# Patient Record
Sex: Female | Born: 2000 | Hispanic: No | Marital: Single | Smoking: Never smoker
Health system: Southern US, Community
[De-identification: ages and names within clinical notes are randomized; demographics above are authoritative.]

---
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
## 2019-02-25 ENCOUNTER — Encounter (HOSPITAL_BASED_OUTPATIENT_CLINIC_OR_DEPARTMENT_OTHER): Payer: Self-pay | Admitting: Emergency Medicine

## 2019-02-25 ENCOUNTER — Emergency Department (HOSPITAL_BASED_OUTPATIENT_CLINIC_OR_DEPARTMENT_OTHER)
Admission: EM | Admit: 2019-02-25 | Discharge: 2019-02-25 | Disposition: A | Discharge status: Home / Self Care | Payer: Self-pay | Attending: Emergency Medicine | Admitting: Emergency Medicine

## 2019-02-25 ENCOUNTER — Other Ambulatory Visit: Payer: Self-pay

## 2019-02-25 ENCOUNTER — Emergency Department (HOSPITAL_COMMUNITY): Payer: Self-pay

## 2019-02-25 ENCOUNTER — Emergency Department (HOSPITAL_BASED_OUTPATIENT_CLINIC_OR_DEPARTMENT_OTHER): Payer: Self-pay

## 2019-02-25 DIAGNOSIS — R0789 Other chest pain: Secondary | ICD-10-CM | POA: Insufficient documentation

## 2019-02-25 DIAGNOSIS — N939 Abnormal uterine and vaginal bleeding, unspecified: Secondary | ICD-10-CM | POA: Insufficient documentation

## 2019-02-25 DIAGNOSIS — R0602 Shortness of breath: Secondary | ICD-10-CM

## 2019-02-25 LAB — URINALYSIS, ROUTINE W REFLEX MICROSCOPIC
BILIRUBIN URINE: NEGATIVE
Glucose, UA: NEGATIVE mg/dL
Hgb urine dipstick: NEGATIVE
Ketones, ur: NEGATIVE mg/dL
Leukocytes,Ua: NEGATIVE
Nitrite: NEGATIVE
PH: 7.5 (ref 5.0–8.0)
Protein, ur: NEGATIVE mg/dL
Specific Gravity, Urine: 1.015 (ref 1.005–1.030)

## 2019-02-25 LAB — CBC WITH DIFFERENTIAL/PLATELET
Abs Immature Granulocytes: 0.01 10*3/uL (ref 0.00–0.07)
Basophils Absolute: 0 10*3/uL (ref 0.0–0.1)
Basophils Relative: 0 %
Eosinophils Absolute: 0.1 10*3/uL (ref 0.0–0.5)
Eosinophils Relative: 1 %
HCT: 44.5 % (ref 36.0–46.0)
HEMOGLOBIN: 13.8 g/dL (ref 12.0–15.0)
Immature Granulocytes: 0 %
LYMPHS ABS: 2.4 10*3/uL (ref 0.7–4.0)
LYMPHS PCT: 37 %
MCH: 29 pg (ref 26.0–34.0)
MCHC: 31 g/dL (ref 30.0–36.0)
MCV: 93.5 fL (ref 80.0–100.0)
MONOS PCT: 6 %
Monocytes Absolute: 0.4 10*3/uL (ref 0.1–1.0)
Neutro Abs: 3.5 10*3/uL (ref 1.7–7.7)
Neutrophils Relative %: 56 %
Platelets: 201 10*3/uL (ref 150–400)
RBC: 4.76 MIL/uL (ref 3.87–5.11)
RDW: 12.4 % (ref 11.5–15.5)
WBC: 6.3 10*3/uL (ref 4.0–10.5)
nRBC: 0 % (ref 0.0–0.2)

## 2019-02-25 LAB — WET PREP, GENITAL
Sperm: NONE SEEN
TRICH WET PREP: NONE SEEN
WBC, Wet Prep HPF POC: NONE SEEN
Yeast Wet Prep HPF POC: NONE SEEN

## 2019-02-25 LAB — BASIC METABOLIC PANEL
Anion gap: 7 (ref 5–15)
BUN: 11 mg/dL (ref 6–20)
CHLORIDE: 105 mmol/L (ref 98–111)
CO2: 26 mmol/L (ref 22–32)
Calcium: 9.6 mg/dL (ref 8.9–10.3)
Creatinine, Ser: 0.59 mg/dL (ref 0.44–1.00)
GFR calc Af Amer: >60 mL/min (ref >60)
GFR calc non Af Amer: >60 mL/min (ref >60)
Glucose, Bld: 106 mg/dL — ABNORMAL HIGH (ref 70–99)
Potassium: 4 mmol/L (ref 3.5–5.1)
Sodium: 138 mmol/L (ref 135–145)

## 2019-02-25 LAB — TROPONIN I: Troponin I: <0.03 ng/mL (ref <0.03)

## 2019-02-25 LAB — PREGNANCY, URINE: Preg Test, Ur: NEGATIVE

## 2019-02-25 MED ORDER — KETOROLAC TROMETHAMINE 30 MG/ML IJ SOLN
30.0000 mg | Freq: Once | INTRAMUSCULAR | Status: AC
  Administered 2019-02-25: 30 mg via INTRAVENOUS
  Filled 2019-02-25: qty 1

## 2019-02-25 NOTE — Discharge Instructions (Signed)
As we discussed, follow-up with your OB/GYN regarding the vaginal bleeding.  Your pelvic exam today was unremarkable.  We do have testing for gonorrhea/committee out.  If you are positive, you will be notified.  You can take Tylenol or Ibuprofen as directed for pain. You can alternate Tylenol and Ibuprofen every 4 hours. If you take Tylenol at 1pm, then you can take Ibuprofen at 5pm. Then you can take Tylenol again at 9pm.   Follow-up with [facility] to establish a primary care doctor if you do not have one.   Return to the Emergency Department immediately if you experiencing worsening chest pain, difficulty breathing, nausea/vomiting, get very sweaty, headache or any other worsening or concerning symptoms.

## 2019-02-25 NOTE — ED Notes (Signed)
Constant mid-sternal CP that is worse with deep inspiration and when leaning forward. Pt denies ShOB, nausea, diaphoresis.

## 2019-02-25 NOTE — ED Triage Notes (Signed)
Vaginal bleeding for 3 weeks. Stopped 4 days ago. Constant chest pain for 2 days. No sob or cough.

## 2019-02-25 NOTE — ED Provider Notes (Signed)
[facility] EMERGENCY DEPARTMENT Provider Note   CSN: [id number] Arrival date & time: 02/25/19  1522    History   Chief Complaint Chief Complaint  Patient presents with   Chest Pain    HPI Janice Berg is a 18 y.o. female who presents for evaluation of 3 weeks of vaginal bleeding and chest pain that has been ongoing for last 2 days.  Patient reports that she started having some vaginal bleeding on 02/02/19.  She states that over the next week, it was heavy bleeding and states that she was fully saturating 1-2 pads every 1-2 hours.  She states that she did notice some passage of clots.  Her LMP was 12/14/18.  She is currently sexually active with one partner.  They do not use protection.  She states that sometimes she will take Plan B pills but otherwise does not have any birth control pills.  Patient states that over this last week, she is still continuing bleeding but states it has lightened up and that it stopped completely about 2 or 3 days ago.  Patient reports 2 days ago, she started developing some constant chest pain that she states is in the midsternal region.  She states that this pain is worse if she applies pressure to it, moves her arms or moves around.  Pain is not worse with deep inspiration or exertion.  She states that if she presses down her chest or lays flat on her chest, it makes it hurt more.  She states it is slightly worse laying down but has not noticed if it is better if she leans forward.  Patient states that when she feels like when she presses on her chest, it caused her to "catch her breath" otherwise denies any difficulty breathing.  She does not take any medication for the symptoms.  Patient denies any fevers, cough, nasal congestion, abdominal pain, nausea/vomiting, dysuria, hematuria. She denies any OCP use, recent immobilization, prior history of DVT/PE, recent surgery, leg swelling, or long travel.  Denies any history of hypertension, diabetes.   She denies any personal cardiac history denies any history of MIs before the age of 32 and her family.  She does not smoke and denies any illicit drug use.     The history is provided by the patient.    History reviewed. No pertinent past medical history.  There are no active problems to display for this patient.   History reviewed. No pertinent surgical history.   OB History   No obstetric history on file.      Home Medications    Prior to Admission medications   Not on File    Family History No family history on file.  Social History Social History   Tobacco Use   Smoking status: Never Smoker   Smokeless tobacco: Never Used  Substance Use Topics   Alcohol use: Yes    Comment: occasional.   Drug use: Never     Allergies   Amoxicillin and Penicillins   Review of Systems Review of Systems  Constitutional: Negative for fever.  Respiratory: Negative for cough and shortness of breath.   Cardiovascular: Positive for chest pain. Negative for leg swelling.  Gastrointestinal: Negative for abdominal pain, nausea and vomiting.  Genitourinary: Positive for vaginal bleeding. Negative for dysuria and hematuria.  Neurological: Negative for headaches.  All other systems reviewed and are negative.    Physical Exam Updated Vital Signs BP 109/65 (BP Location: Left Arm)    Pulse 86  Temp 98.4 F (36.9 C) (Oral)    Resp 18    Ht 5' (1.524 m)    Wt 61.2 kg    LMP 02/09/2019 Comment: bleeding constantly x 2 1/2 wks   SpO2 100%    BMI 26.37 kg/m   Physical Exam Vitals signs and nursing note reviewed. Exam conducted with a chaperone present.  Constitutional:      Appearance: Normal appearance. She is well-developed.  HENT:     Head: Normocephalic and atraumatic.  Eyes:     General: Lids are normal.     Conjunctiva/sclera: Conjunctivae normal.     Pupils: Pupils are equal, round, and reactive to light.  Neck:     Musculoskeletal: Full passive range of motion  without pain.  Cardiovascular:     Rate and Rhythm: Normal rate and regular rhythm.     Pulses: Normal pulses.     Heart sounds: Normal heart sounds. No murmur. No friction rub. No gallop.   Pulmonary:     Effort: Pulmonary effort is normal.     Breath sounds: Normal breath sounds.     Comments: Lungs clear to auscultation bilaterally.  Symmetric chest rise.  No wheezing, rales, rhonchi. Chest:     Chest wall: Tenderness present.       Comments: Pain reproduced with palpation of chest and with moving arms. Abdominal:     Palpations: Abdomen is soft. Abdomen is not rigid.     Tenderness: There is no abdominal tenderness. There is no guarding.     Comments: Abdomen is soft, non-distended, non-tender. No rigidity, No guarding. No peritoneal signs.  Genitourinary:    Vagina: Normal. No bleeding.     Cervix: No cervical motion tenderness, discharge or friability.     Adnexa: Right adnexa normal and left adnexa normal.       Right: No mass or tenderness.         Left: No mass or tenderness.       Comments: The exam was performed with a chaperone present. Normal external female genitalia. No lesions, rash, or sores.  No CMT.  No cervical discharge, friability.  No bleeding evidence.  No adnexal mass or tenderness bilaterally. Musculoskeletal: Normal range of motion.  Skin:    General: Skin is warm and dry.     Capillary Refill: Capillary refill takes less than 2 seconds.  Neurological:     Mental Status: She is alert and oriented to person, place, and time.  Psychiatric:        Speech: Speech normal.      ED Treatments / Results  Labs (all labs ordered are listed, but only abnormal results are displayed) Labs Reviewed  WET PREP, GENITAL - Abnormal; Notable for the following components:      Result Value   Clue Cells Wet Prep HPF POC PRESENT (*)    All other components within normal limits  BASIC METABOLIC PANEL - Abnormal; Notable for the following components:   Glucose, Bld  106 (*)    All other components within normal limits  URINALYSIS, ROUTINE W REFLEX MICROSCOPIC  PREGNANCY, URINE  CBC WITH DIFFERENTIAL/PLATELET  TROPONIN I  GC/CHLAMYDIA PROBE AMP ([place]) NOT AT [facility]    EKG None  Radiology Dg Chest 2 View  Result Date: 02/25/2019 CLINICAL DATA:  Chest pain EXAM: CHEST - 2 VIEW COMPARISON:  None. FINDINGS: The heart size and mediastinal contours are within normal limits. Both lungs are clear. The visualized skeletal structures are unremarkable. IMPRESSION: No  active cardiopulmonary disease. Electronically Signed   By: Jasmine Pang M.D.   On: 02/25/2019 16:55    Procedures Procedures (including critical care time)  Medications Ordered in ED Medications  ketorolac (TORADOL) 30 MG/ML injection 30 mg (30 mg Intravenous Given 02/25/19 1721)     Initial Impression / Assessment and Plan / ED Course  I have reviewed the triage vital signs and the nursing notes.  Pertinent labs & imaging results that were available during my care of the patient were reviewed by me and considered in my medical decision making (see chart for details).        18 year old female who presents for evaluation of vaginal bleeding is been ongoing for last several weeks.  She reports that as improved but she also started developing some chest pain that began about 2 days ago. Patient is afebrile, non-toxic appearing, sitting comfortably on examination table. Vital signs reviewed and stable.  No fever, cough, shortness of breath.  She does feel like whenever she presses on it, it causes her to catch her breath.  She states pain is worse with pressure, movement.  Patient with no history of OCPs, blood clots, leg swelling. Patient is PERC negative.  Do not suspect PE as a source of patient's symptoms.  Doubt ACS etiology as this is atypical presentation, but a consideration.  Additionally, do not suspect pericarditis, myocarditis.  Low suspicion for infectious etiology given lack  of symptoms. Consider  musculoskeletal etiology, particularly, given that the pain is worse with movement..  Will plan to check labs, EKG, chest x-ray as well as pelvic.  Pelvic exam as documented above.  Exam not concerning for PID.  No evidence of vaginal bleeding.  Troponin negative.  BMP is unremarkable.  CBC shows no evidence of leukocytosis or anemia.  Urine pregnancy is negative.  UA negative for any infectious etiology.  Wet prep positive for clue cells.  Given that patient is not symptomatic, will not treat.  Given that her symptoms have been constant for 2 days and as well as her atypical presentation, 1 troponin is sufficient for ACS rule out.  Additionally, patient has no risk factors for ACS etiology.  Reevaluation.  Patient reports feeling improvement in pain after analgesics here in the ED.  At this time, I suspect this most likely related to musculoskeletal etiology. At this time, patient exhibits no emergent life-threatening condition that require further evaluation in ED or admission.  Patient to follow-up with outpatient OB/GYN for persistent vaginal bleeding. Patient had ample opportunity for questions and discussion. All patient's questions were answered with full understanding. Strict return precautions discussed. Patient expresses understanding and agreement to plan.   Portions of this note were generated with Scientist, clinical (histocompatibility and immunogenetics). Dictation errors may occur despite best attempts at proofreading.   Final Clinical Impressions(s) / ED Diagnoses   Final diagnoses:  Atypical chest pain  Abnormal uterine bleeding    ED Discharge Orders    None       Rosana Hoes 02/25/19 2316    Little, Ambrose [place], MD 02/27/19 1200

## 2019-02-28 LAB — GC/CHLAMYDIA PROBE AMP (~~LOC~~) NOT AT ARMC
CHLAMYDIA, DNA PROBE: NEGATIVE
NEISSERIA GONORRHEA: NEGATIVE

## 2019-05-26 DIAGNOSIS — H52223 Regular astigmatism, bilateral: Secondary | ICD-10-CM | POA: Diagnosis not present

## 2019-05-26 DIAGNOSIS — H5213 Myopia, bilateral: Secondary | ICD-10-CM | POA: Diagnosis not present

## 2019-05-27 DIAGNOSIS — H5213 Myopia, bilateral: Secondary | ICD-10-CM | POA: Diagnosis not present

## 2019-06-07 DIAGNOSIS — H5213 Myopia, bilateral: Secondary | ICD-10-CM | POA: Diagnosis not present

## 2019-10-18 DIAGNOSIS — Z20828 Contact with and (suspected) exposure to other viral communicable diseases: Secondary | ICD-10-CM | POA: Diagnosis not present

## 2019-10-18 DIAGNOSIS — R05 Cough: Secondary | ICD-10-CM | POA: Diagnosis not present

## 2019-10-18 DIAGNOSIS — J029 Acute pharyngitis, unspecified: Secondary | ICD-10-CM | POA: Diagnosis not present

## 2019-10-18 DIAGNOSIS — R52 Pain, unspecified: Secondary | ICD-10-CM | POA: Diagnosis not present

## 2019-10-25 ENCOUNTER — Other Ambulatory Visit: Payer: Self-pay

## 2019-10-25 ENCOUNTER — Emergency Department (INDEPENDENT_AMBULATORY_CARE_PROVIDER_SITE_OTHER)
Admission: EM | Admit: 2019-10-25 | Discharge: 2019-10-25 | Disposition: A | Discharge status: Home / Self Care | Payer: Medicaid Other | Source: Home / Self Care

## 2019-10-25 DIAGNOSIS — Z20828 Contact with and (suspected) exposure to other viral communicable diseases: Secondary | ICD-10-CM

## 2019-10-25 DIAGNOSIS — R059 Cough, unspecified: Secondary | ICD-10-CM

## 2019-10-25 DIAGNOSIS — R05 Cough: Secondary | ICD-10-CM | POA: Diagnosis not present

## 2019-10-25 DIAGNOSIS — Z20822 Contact with and (suspected) exposure to covid-19: Secondary | ICD-10-CM

## 2019-10-25 DIAGNOSIS — J069 Acute upper respiratory infection, unspecified: Secondary | ICD-10-CM | POA: Diagnosis not present

## 2019-10-25 NOTE — ED Provider Notes (Signed)
[facility]    CSN: [id number] Arrival date & time: 10/25/19  1401      History   Chief Complaint Chief Complaint  Patient presents with  . Cough  . Fatigue  . Bodyaches    HPI Jamila Slatten is a 18 y.o. female.   18 year old female, with no significant past medical history, presenting today complaining of flulike symptoms.  Patient states that for the past 7 days, she has had cough, fatigue, body aches and sore throat.  States that she had a exposure to Covid at work and has had symptoms since that time.  Had a rapid test done in a different location last week that was negative.  However, since she is still having symptoms, her job is requiring her to have a send out test.  Patient states that she is actually feeling better since the onset of her symptoms.  She has not had fever in the past few days.  Has been using DayQuil and NyQuil with improvement of her symptoms.  No chest pain or shortness of breath.  The history is provided by the patient.  Cough Cough characteristics:  Dry Sputum characteristics:  Nondescript Severity:  Moderate Onset quality:  Gradual Duration:  7 days Timing:  Intermittent Progression:  Unchanged Chronicity:  New Smoker: no   Context: sick contacts   Context: not animal exposure, not exposure to allergens and not fumes   Relieved by:  Cough suppressants Worsened by:  Nothing Ineffective treatments:  None tried Associated symptoms: chills, fever, headaches, myalgias, rhinorrhea, sinus congestion and sore throat   Associated symptoms: no chest pain, no diaphoresis, no ear fullness, no ear pain, no eye discharge, no rash, no shortness of breath, no weight loss and no wheezing   Risk factors: no chemical exposure, no recent infection and no recent travel     History reviewed. No pertinent past medical history.  There are no active problems to display for this patient.   History reviewed. No pertinent surgical history.  OB  History   No obstetric history on file.      Home Medications    Prior to Admission medications   Not on File    Family History History reviewed. No pertinent family history.  Social History Social History   Tobacco Use  . Smoking status: Never Smoker  . Smokeless tobacco: Never Used  Substance Use Topics  . Alcohol use: Yes    Comment: occasional.  . Drug use: Never     Allergies   Amoxicillin and Penicillins   Review of Systems Review of Systems  Constitutional: Positive for chills and fever. Negative for diaphoresis and weight loss.  HENT: Positive for rhinorrhea and sore throat. Negative for ear pain.   Eyes: Negative for pain, discharge and visual disturbance.  Respiratory: Positive for cough. Negative for shortness of breath and wheezing.   Cardiovascular: Negative for chest pain and palpitations.  Gastrointestinal: Negative for abdominal pain and vomiting.  Genitourinary: Negative for dysuria and hematuria.  Musculoskeletal: Positive for myalgias. Negative for arthralgias and back pain.  Skin: Negative for color change and rash.  Neurological: Positive for headaches. Negative for seizures and syncope.  All other systems reviewed and are negative.    Physical Exam Triage Vital Signs ED Triage Vitals  Enc Vitals Group     BP 10/25/19 1419 114/76     Pulse Rate 10/25/19 1419 83     Resp 10/25/19 1419 18     Temp 10/25/19 1419 97.9 F (  36.6 C)     Temp Source 10/25/19 1419 Oral     SpO2 10/25/19 1419 98 %     Weight --      Height --      Head Circumference --      Peak Flow --      Pain Score 10/25/19 1420 0     Pain Loc --      Pain Edu? --      Excl. in [place]? --    No data found.  Updated Vital Signs BP 114/76 (BP Location: Right Arm)   Pulse 83   Temp 97.9 F (36.6 C) (Oral)   Resp 18   LMP  (LMP Unknown)   SpO2 98%   Visual Acuity Right Eye Distance:   Left Eye Distance:   Bilateral Distance:    Right Eye Near:   Left Eye  Near:    Bilateral Near:     Physical Exam Vitals signs and nursing note reviewed.  Constitutional:      General: She is not in acute distress.    Appearance: She is well-developed.  HENT:     Head: Normocephalic and atraumatic.     Right Ear: Hearing, tympanic membrane, ear canal and external ear normal.     Left Ear: Hearing, tympanic membrane, ear canal and external ear normal.     Nose: Nose normal.     Mouth/Throat:     Pharynx: No oropharyngeal exudate or posterior oropharyngeal erythema.     Tonsils: No tonsillar abscesses.  Eyes:     Conjunctiva/sclera: Conjunctivae normal.  Neck:     Musculoskeletal: Neck supple.  Cardiovascular:     Rate and Rhythm: Normal rate and regular rhythm.     Heart sounds: No murmur.  Pulmonary:     Effort: Pulmonary effort is normal. No respiratory distress.     Breath sounds: Normal breath sounds. No stridor. No wheezing, rhonchi or rales.  Abdominal:     Palpations: Abdomen is soft.     Tenderness: There is no abdominal tenderness.  Skin:    General: Skin is warm and dry.  Neurological:     Mental Status: She is alert.      UC Treatments / Results  Labs (all labs ordered are listed, but only abnormal results are displayed) Labs Reviewed  NOVEL CORONAVIRUS, NAA    EKG   Radiology No results found.  Procedures Procedures (including critical care time)  Medications Ordered in UC Medications - No data to display  Initial Impression / Assessment and Plan / UC Course  I have reviewed the triage vital signs and the nursing notes.  Pertinent labs & imaging results that were available during my care of the patient were reviewed by me and considered in my medical decision making (see chart for details).     Patient has had symptoms of Covid for the past 7 days.  Outpatient test was negative last week.  However, she has to have a send out test returned to work as she has ongoing symptoms. Final Clinical Impressions(s) / UC  Diagnoses   Final diagnoses:  Cough  Viral URI with cough  Close exposure to COVID-19 virus   Discharge Instructions   None    ED Prescriptions    None     PDMP not reviewed this encounter.   Phebe Colla, [place] 10/25/19 1430

## 2019-10-25 NOTE — ED Triage Notes (Signed)
Pt c/o COVID like sxs since 11/10. Says there has been an [place] where she works. Rapid neg on 11/12 but work is requiring a send out test since shes still having symptoms. Taking dayquil and nyquil prn.

## 2019-10-27 LAB — NOVEL CORONAVIRUS, NAA: SARS-CoV-2, NAA: NOT DETECTED

## 2019-10-30 ENCOUNTER — Telehealth: Payer: Self-pay

## 2019-10-30 NOTE — Telephone Encounter (Signed)
Pt called for covid results. Informed of neg test.

## 2019-12-26 IMAGING — CR CHEST - 2 VIEW
2 series · 2 of 2 positions shown · non-contrast
Comparison: None.

CLINICAL DATA: Chest pain

EXAM:
CHEST - 2 VIEW

[w chest pa]
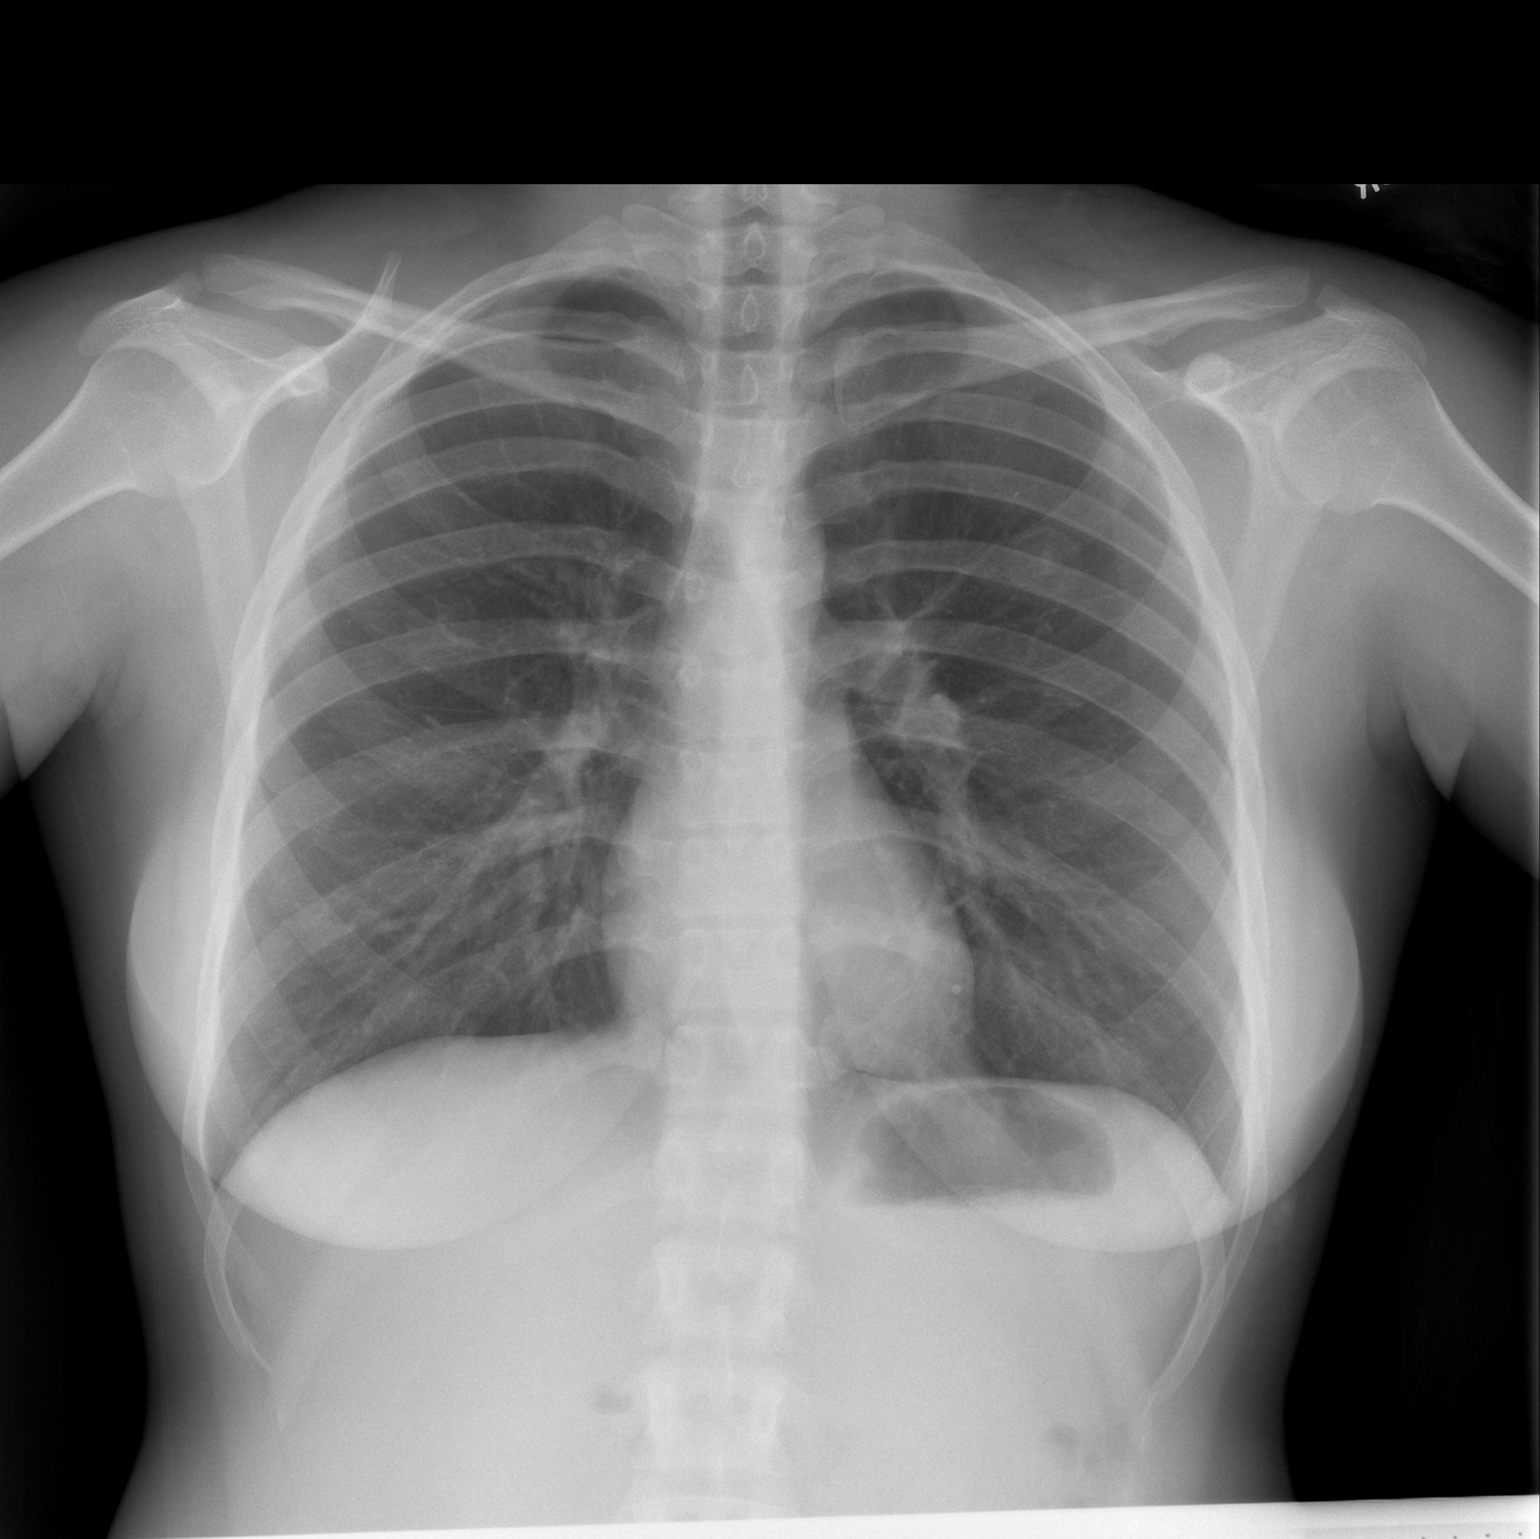

[w chest lat]
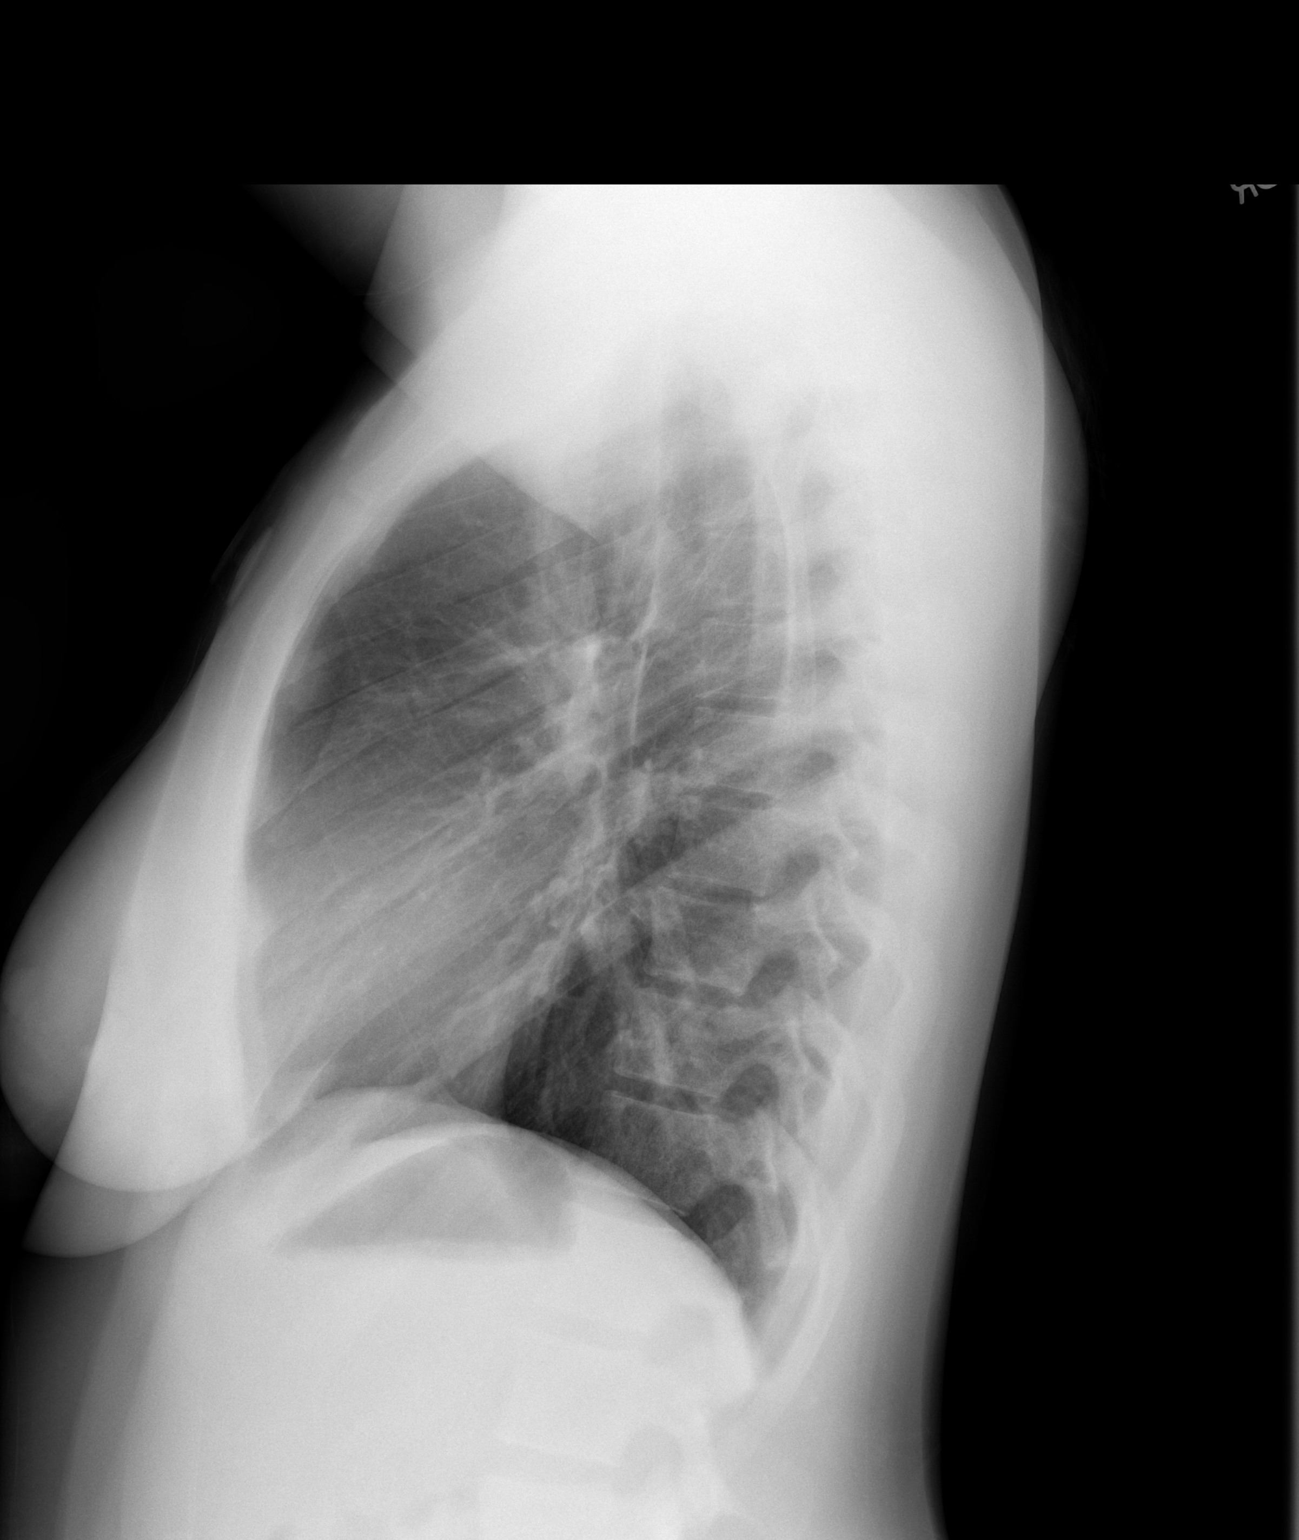

[2 of 2 positions shown; findings below may reference images not displayed]

FINDINGS: The heart size and mediastinal contours are within normal limits.
Both lungs are clear. The visualized skeletal structures are
unremarkable.
IMPRESSION: No active cardiopulmonary disease.

## 2020-02-08 DIAGNOSIS — Z20828 Contact with and (suspected) exposure to other viral communicable diseases: Secondary | ICD-10-CM | POA: Diagnosis not present

## 2020-02-12 DIAGNOSIS — Z20828 Contact with and (suspected) exposure to other viral communicable diseases: Secondary | ICD-10-CM | POA: Diagnosis not present

## 2020-04-09 DIAGNOSIS — U071 COVID-19: Secondary | ICD-10-CM | POA: Diagnosis not present

## 2020-04-29 DIAGNOSIS — Z20822 Contact with and (suspected) exposure to covid-19: Secondary | ICD-10-CM | POA: Diagnosis not present

## 2020-05-13 DIAGNOSIS — Z20822 Contact with and (suspected) exposure to covid-19: Secondary | ICD-10-CM | POA: Diagnosis not present

## 2020-09-11 DIAGNOSIS — N946 Dysmenorrhea, unspecified: Secondary | ICD-10-CM | POA: Diagnosis not present

## 2020-09-25 DIAGNOSIS — Z01419 Encounter for gynecological examination (general) (routine) without abnormal findings: Secondary | ICD-10-CM | POA: Diagnosis not present

## 2020-09-25 DIAGNOSIS — N898 Other specified noninflammatory disorders of vagina: Secondary | ICD-10-CM | POA: Diagnosis not present

## 2021-02-04 DIAGNOSIS — N915 Oligomenorrhea, unspecified: Secondary | ICD-10-CM | POA: Diagnosis not present

## 2021-02-04 DIAGNOSIS — O200 Threatened abortion: Secondary | ICD-10-CM | POA: Diagnosis not present

## 2021-02-04 DIAGNOSIS — O3680X0 Pregnancy with inconclusive fetal viability, not applicable or unspecified: Secondary | ICD-10-CM | POA: Diagnosis not present

## 2021-02-04 DIAGNOSIS — O26891 Other specified pregnancy related conditions, first trimester: Secondary | ICD-10-CM | POA: Diagnosis not present

## 2021-02-04 DIAGNOSIS — O209 Hemorrhage in early pregnancy, unspecified: Secondary | ICD-10-CM | POA: Diagnosis not present

## 2021-02-04 DIAGNOSIS — R102 Pelvic and perineal pain: Secondary | ICD-10-CM | POA: Diagnosis not present

## 2021-02-12 DIAGNOSIS — E663 Overweight: Secondary | ICD-10-CM | POA: Diagnosis not present

## 2021-02-12 DIAGNOSIS — O219 Vomiting of pregnancy, unspecified: Secondary | ICD-10-CM | POA: Diagnosis not present

## 2021-02-12 DIAGNOSIS — Z6825 Body mass index (BMI) 25.0-25.9, adult: Secondary | ICD-10-CM | POA: Diagnosis not present

## 2021-02-12 DIAGNOSIS — Z3481 Encounter for supervision of other normal pregnancy, first trimester: Secondary | ICD-10-CM | POA: Diagnosis not present

## 2021-02-12 DIAGNOSIS — Z3689 Encounter for other specified antenatal screening: Secondary | ICD-10-CM | POA: Diagnosis not present

## 2021-02-12 DIAGNOSIS — Z113 Encounter for screening for infections with a predominantly sexual mode of transmission: Secondary | ICD-10-CM | POA: Diagnosis not present

## 2021-02-12 DIAGNOSIS — Z3A08 8 weeks gestation of pregnancy: Secondary | ICD-10-CM | POA: Diagnosis not present

## 2021-02-12 DIAGNOSIS — Z3480 Encounter for supervision of other normal pregnancy, unspecified trimester: Secondary | ICD-10-CM | POA: Diagnosis not present

## 2021-02-12 DIAGNOSIS — Z369 Encounter for antenatal screening, unspecified: Secondary | ICD-10-CM | POA: Diagnosis not present

## 2021-02-12 DIAGNOSIS — Z124 Encounter for screening for malignant neoplasm of cervix: Secondary | ICD-10-CM | POA: Diagnosis not present

## 2021-02-12 DIAGNOSIS — Z98891 History of uterine scar from previous surgery: Secondary | ICD-10-CM | POA: Diagnosis not present

## 2021-02-12 DIAGNOSIS — Z8759 Personal history of other complications of pregnancy, childbirth and the puerperium: Secondary | ICD-10-CM | POA: Diagnosis not present

## 2021-03-11 DIAGNOSIS — Z3682 Encounter for antenatal screening for nuchal translucency: Secondary | ICD-10-CM | POA: Diagnosis not present

## 2021-04-23 DIAGNOSIS — Z3689 Encounter for other specified antenatal screening: Secondary | ICD-10-CM | POA: Diagnosis not present

## 2021-04-23 DIAGNOSIS — Z3A18 18 weeks gestation of pregnancy: Secondary | ICD-10-CM | POA: Diagnosis not present

## 2021-05-15 DIAGNOSIS — Z20822 Contact with and (suspected) exposure to covid-19: Secondary | ICD-10-CM | POA: Diagnosis not present

## 2021-05-15 DIAGNOSIS — R059 Cough, unspecified: Secondary | ICD-10-CM | POA: Diagnosis not present

## 2021-05-15 DIAGNOSIS — H6691 Otitis media, unspecified, right ear: Secondary | ICD-10-CM | POA: Diagnosis not present

## 2021-05-15 DIAGNOSIS — J029 Acute pharyngitis, unspecified: Secondary | ICD-10-CM | POA: Diagnosis not present

## 2021-06-06 DIAGNOSIS — Z3482 Encounter for supervision of other normal pregnancy, second trimester: Secondary | ICD-10-CM | POA: Diagnosis not present

## 2021-06-06 DIAGNOSIS — Z113 Encounter for screening for infections with a predominantly sexual mode of transmission: Secondary | ICD-10-CM | POA: Diagnosis not present

## 2021-06-06 DIAGNOSIS — Z3A24 24 weeks gestation of pregnancy: Secondary | ICD-10-CM | POA: Diagnosis not present

## 2021-06-28 DIAGNOSIS — Z23 Encounter for immunization: Secondary | ICD-10-CM | POA: Diagnosis not present

## 2021-06-28 DIAGNOSIS — Z3A28 28 weeks gestation of pregnancy: Secondary | ICD-10-CM | POA: Diagnosis not present

## 2021-06-28 DIAGNOSIS — Z3483 Encounter for supervision of other normal pregnancy, third trimester: Secondary | ICD-10-CM | POA: Diagnosis not present

## 2021-08-19 DIAGNOSIS — O34211 Maternal care for low transverse scar from previous cesarean delivery: Secondary | ICD-10-CM | POA: Diagnosis not present

## 2021-08-19 DIAGNOSIS — O42013 Preterm premature rupture of membranes, onset of labor within 24 hours of rupture, third trimester: Secondary | ICD-10-CM | POA: Diagnosis not present

## 2021-08-19 DIAGNOSIS — O411230 Chorioamnionitis, third trimester, not applicable or unspecified: Secondary | ICD-10-CM | POA: Diagnosis not present

## 2021-08-19 DIAGNOSIS — Z880 Allergy status to penicillin: Secondary | ICD-10-CM | POA: Diagnosis not present

## 2021-08-19 DIAGNOSIS — O3443 Maternal care for other abnormalities of cervix, third trimester: Secondary | ICD-10-CM | POA: Diagnosis not present

## 2021-08-19 DIAGNOSIS — O26853 Spotting complicating pregnancy, third trimester: Secondary | ICD-10-CM | POA: Diagnosis not present

## 2021-08-19 DIAGNOSIS — Z8759 Personal history of other complications of pregnancy, childbirth and the puerperium: Secondary | ICD-10-CM | POA: Diagnosis not present

## 2021-08-19 DIAGNOSIS — Z3A35 35 weeks gestation of pregnancy: Secondary | ICD-10-CM | POA: Diagnosis not present

## 2021-08-19 DIAGNOSIS — O418X30 Other specified disorders of amniotic fluid and membranes, third trimester, not applicable or unspecified: Secondary | ICD-10-CM | POA: Diagnosis not present

## 2021-08-19 DIAGNOSIS — O99214 Obesity complicating childbirth: Secondary | ICD-10-CM | POA: Diagnosis not present

## 2021-08-19 DIAGNOSIS — O99213 Obesity complicating pregnancy, third trimester: Secondary | ICD-10-CM | POA: Diagnosis not present

## 2021-08-19 DIAGNOSIS — O4593 Premature separation of placenta, unspecified, third trimester: Secondary | ICD-10-CM | POA: Diagnosis not present

## 2021-12-06 DIAGNOSIS — Z20822 Contact with and (suspected) exposure to covid-19: Secondary | ICD-10-CM | POA: Diagnosis not present

## 2021-12-06 DIAGNOSIS — Z03818 Encounter for observation for suspected exposure to other biological agents ruled out: Secondary | ICD-10-CM | POA: Diagnosis not present

## 2022-09-01 DIAGNOSIS — N898 Other specified noninflammatory disorders of vagina: Secondary | ICD-10-CM | POA: Diagnosis not present

## 2022-09-15 DIAGNOSIS — Z0000 Encounter for general adult medical examination without abnormal findings: Secondary | ICD-10-CM | POA: Diagnosis not present

## 2022-09-15 DIAGNOSIS — Z8041 Family history of malignant neoplasm of ovary: Secondary | ICD-10-CM | POA: Diagnosis not present

## 2022-09-15 DIAGNOSIS — R635 Abnormal weight gain: Secondary | ICD-10-CM | POA: Diagnosis not present

## 2023-01-09 DIAGNOSIS — A084 Viral intestinal infection, unspecified: Secondary | ICD-10-CM | POA: Diagnosis not present

## 2023-01-09 DIAGNOSIS — Z3202 Encounter for pregnancy test, result negative: Secondary | ICD-10-CM | POA: Diagnosis not present

## 2023-01-09 DIAGNOSIS — R1110 Vomiting, unspecified: Secondary | ICD-10-CM | POA: Diagnosis not present

## 2023-01-09 DIAGNOSIS — Z20822 Contact with and (suspected) exposure to covid-19: Secondary | ICD-10-CM | POA: Diagnosis not present

## 2023-01-09 DIAGNOSIS — R509 Fever, unspecified: Secondary | ICD-10-CM | POA: Diagnosis not present

## 2023-02-11 DIAGNOSIS — R509 Fever, unspecified: Secondary | ICD-10-CM | POA: Diagnosis not present

## 2023-02-11 DIAGNOSIS — Z20822 Contact with and (suspected) exposure to covid-19: Secondary | ICD-10-CM | POA: Diagnosis not present

## 2023-02-11 DIAGNOSIS — Z880 Allergy status to penicillin: Secondary | ICD-10-CM | POA: Diagnosis not present

## 2023-02-11 DIAGNOSIS — J020 Streptococcal pharyngitis: Secondary | ICD-10-CM | POA: Diagnosis not present

## 2023-08-03 DIAGNOSIS — Z041 Encounter for examination and observation following transport accident: Secondary | ICD-10-CM | POA: Diagnosis not present

## 2023-08-03 DIAGNOSIS — M70941 Unspecified soft tissue disorder related to use, overuse and pressure, right hand: Secondary | ICD-10-CM | POA: Diagnosis not present

## 2023-08-03 DIAGNOSIS — S3993XA Unspecified injury of pelvis, initial encounter: Secondary | ICD-10-CM | POA: Diagnosis not present

## 2023-08-03 DIAGNOSIS — S3991XA Unspecified injury of abdomen, initial encounter: Secondary | ICD-10-CM | POA: Diagnosis not present

## 2023-08-18 DIAGNOSIS — S6991XA Unspecified injury of right wrist, hand and finger(s), initial encounter: Secondary | ICD-10-CM | POA: Diagnosis not present

## 2023-11-18 DIAGNOSIS — Z1329 Encounter for screening for other suspected endocrine disorder: Secondary | ICD-10-CM | POA: Diagnosis not present

## 2023-11-18 DIAGNOSIS — Z13220 Encounter for screening for lipoid disorders: Secondary | ICD-10-CM | POA: Diagnosis not present

## 2023-11-18 DIAGNOSIS — E559 Vitamin D deficiency, unspecified: Secondary | ICD-10-CM | POA: Diagnosis not present

## 2023-11-18 DIAGNOSIS — R5382 Chronic fatigue, unspecified: Secondary | ICD-10-CM | POA: Diagnosis not present

## 2023-11-18 DIAGNOSIS — Z6831 Body mass index (BMI) 31.0-31.9, adult: Secondary | ICD-10-CM | POA: Diagnosis not present

## 2023-11-18 DIAGNOSIS — Z130 Encounter for screening for diseases of the blood and blood-forming organs and certain disorders involving the immune mechanism: Secondary | ICD-10-CM | POA: Diagnosis not present

## 2023-11-18 DIAGNOSIS — G479 Sleep disorder, unspecified: Secondary | ICD-10-CM | POA: Diagnosis not present

## 2023-11-18 DIAGNOSIS — N951 Menopausal and female climacteric states: Secondary | ICD-10-CM | POA: Diagnosis not present

## 2023-11-18 DIAGNOSIS — Z131 Encounter for screening for diabetes mellitus: Secondary | ICD-10-CM | POA: Diagnosis not present

## 2023-11-20 DIAGNOSIS — E669 Obesity, unspecified: Secondary | ICD-10-CM | POA: Diagnosis not present

## 2023-11-20 DIAGNOSIS — N951 Menopausal and female climacteric states: Secondary | ICD-10-CM | POA: Diagnosis not present

## 2023-11-20 DIAGNOSIS — G479 Sleep disorder, unspecified: Secondary | ICD-10-CM | POA: Diagnosis not present

## 2023-11-20 DIAGNOSIS — Z1331 Encounter for screening for depression: Secondary | ICD-10-CM | POA: Diagnosis not present

## 2023-11-20 DIAGNOSIS — Z1339 Encounter for screening examination for other mental health and behavioral disorders: Secondary | ICD-10-CM | POA: Diagnosis not present

## 2023-11-20 DIAGNOSIS — R635 Abnormal weight gain: Secondary | ICD-10-CM | POA: Diagnosis not present

## 2023-11-20 DIAGNOSIS — Z6831 Body mass index (BMI) 31.0-31.9, adult: Secondary | ICD-10-CM | POA: Diagnosis not present

## 2023-11-20 DIAGNOSIS — E559 Vitamin D deficiency, unspecified: Secondary | ICD-10-CM | POA: Diagnosis not present

## 2023-11-20 DIAGNOSIS — R232 Flushing: Secondary | ICD-10-CM | POA: Diagnosis not present

## 2023-11-21 DIAGNOSIS — H6991 Unspecified Eustachian tube disorder, right ear: Secondary | ICD-10-CM | POA: Diagnosis not present

## 2023-11-21 DIAGNOSIS — H6691 Otitis media, unspecified, right ear: Secondary | ICD-10-CM | POA: Diagnosis not present

## 2023-11-26 DIAGNOSIS — E559 Vitamin D deficiency, unspecified: Secondary | ICD-10-CM | POA: Diagnosis not present

## 2023-11-26 DIAGNOSIS — Z6830 Body mass index (BMI) 30.0-30.9, adult: Secondary | ICD-10-CM | POA: Diagnosis not present

## 2023-12-05 DIAGNOSIS — N3000 Acute cystitis without hematuria: Secondary | ICD-10-CM | POA: Diagnosis not present

## 2023-12-05 DIAGNOSIS — R110 Nausea: Secondary | ICD-10-CM | POA: Diagnosis not present

## 2023-12-05 DIAGNOSIS — R1084 Generalized abdominal pain: Secondary | ICD-10-CM | POA: Diagnosis not present

## 2023-12-25 DIAGNOSIS — Z3202 Encounter for pregnancy test, result negative: Secondary | ICD-10-CM | POA: Diagnosis not present

## 2023-12-25 DIAGNOSIS — N898 Other specified noninflammatory disorders of vagina: Secondary | ICD-10-CM | POA: Diagnosis not present

## 2023-12-25 DIAGNOSIS — N760 Acute vaginitis: Secondary | ICD-10-CM | POA: Diagnosis not present

## 2024-01-06 DIAGNOSIS — J029 Acute pharyngitis, unspecified: Secondary | ICD-10-CM | POA: Diagnosis not present

## 2024-01-06 DIAGNOSIS — R591 Generalized enlarged lymph nodes: Secondary | ICD-10-CM | POA: Diagnosis not present

## 2024-01-06 DIAGNOSIS — R1310 Dysphagia, unspecified: Secondary | ICD-10-CM | POA: Diagnosis not present

## 2024-01-11 DIAGNOSIS — J038 Acute tonsillitis due to other specified organisms: Secondary | ICD-10-CM | POA: Diagnosis not present

## 2024-01-11 DIAGNOSIS — B9689 Other specified bacterial agents as the cause of diseases classified elsewhere: Secondary | ICD-10-CM | POA: Diagnosis not present

## 2024-01-11 DIAGNOSIS — H6691 Otitis media, unspecified, right ear: Secondary | ICD-10-CM | POA: Diagnosis not present

## 2024-01-11 DIAGNOSIS — E6609 Other obesity due to excess calories: Secondary | ICD-10-CM | POA: Diagnosis not present

## 2024-01-11 DIAGNOSIS — J029 Acute pharyngitis, unspecified: Secondary | ICD-10-CM | POA: Diagnosis not present

## 2024-01-14 DIAGNOSIS — Z3009 Encounter for other general counseling and advice on contraception: Secondary | ICD-10-CM | POA: Diagnosis not present

## 2024-01-14 DIAGNOSIS — N926 Irregular menstruation, unspecified: Secondary | ICD-10-CM | POA: Diagnosis not present

## 2024-01-20 DIAGNOSIS — J029 Acute pharyngitis, unspecified: Secondary | ICD-10-CM | POA: Diagnosis not present

## 2024-01-20 DIAGNOSIS — J00 Acute nasopharyngitis [common cold]: Secondary | ICD-10-CM | POA: Diagnosis not present

## 2024-01-20 DIAGNOSIS — J3489 Other specified disorders of nose and nasal sinuses: Secondary | ICD-10-CM | POA: Diagnosis not present

## 2024-02-16 DIAGNOSIS — Z113 Encounter for screening for infections with a predominantly sexual mode of transmission: Secondary | ICD-10-CM | POA: Diagnosis not present

## 2024-02-16 DIAGNOSIS — N939 Abnormal uterine and vaginal bleeding, unspecified: Secondary | ICD-10-CM | POA: Diagnosis not present

## 2024-02-24 DIAGNOSIS — R42 Dizziness and giddiness: Secondary | ICD-10-CM | POA: Diagnosis not present

## 2024-02-24 DIAGNOSIS — R079 Chest pain, unspecified: Secondary | ICD-10-CM | POA: Diagnosis not present

## 2024-02-24 DIAGNOSIS — Z79899 Other long term (current) drug therapy: Secondary | ICD-10-CM | POA: Diagnosis not present

## 2024-02-24 DIAGNOSIS — R002 Palpitations: Secondary | ICD-10-CM | POA: Diagnosis not present

## 2024-02-24 DIAGNOSIS — R000 Tachycardia, unspecified: Secondary | ICD-10-CM | POA: Diagnosis not present

## 2024-02-24 DIAGNOSIS — R7989 Other specified abnormal findings of blood chemistry: Secondary | ICD-10-CM | POA: Diagnosis not present

## 2024-02-24 DIAGNOSIS — R0789 Other chest pain: Secondary | ICD-10-CM | POA: Diagnosis not present

## 2024-03-03 DIAGNOSIS — Z124 Encounter for screening for malignant neoplasm of cervix: Secondary | ICD-10-CM | POA: Diagnosis not present

## 2024-03-03 DIAGNOSIS — N939 Abnormal uterine and vaginal bleeding, unspecified: Secondary | ICD-10-CM | POA: Diagnosis not present

## 2024-03-03 DIAGNOSIS — Z3202 Encounter for pregnancy test, result negative: Secondary | ICD-10-CM | POA: Diagnosis not present

## 2024-03-03 DIAGNOSIS — E282 Polycystic ovarian syndrome: Secondary | ICD-10-CM | POA: Diagnosis not present

## 2024-03-03 DIAGNOSIS — L680 Hirsutism: Secondary | ICD-10-CM | POA: Diagnosis not present

## 2024-03-03 DIAGNOSIS — Z30017 Encounter for initial prescription of implantable subdermal contraceptive: Secondary | ICD-10-CM | POA: Diagnosis not present

## 2024-03-04 DIAGNOSIS — Z30017 Encounter for initial prescription of implantable subdermal contraceptive: Secondary | ICD-10-CM | POA: Diagnosis not present

## 2024-03-04 DIAGNOSIS — N939 Abnormal uterine and vaginal bleeding, unspecified: Secondary | ICD-10-CM | POA: Diagnosis not present

## 2024-03-04 DIAGNOSIS — E282 Polycystic ovarian syndrome: Secondary | ICD-10-CM | POA: Diagnosis not present

## 2024-03-04 DIAGNOSIS — L680 Hirsutism: Secondary | ICD-10-CM | POA: Diagnosis not present

## 2024-03-04 DIAGNOSIS — Z124 Encounter for screening for malignant neoplasm of cervix: Secondary | ICD-10-CM | POA: Diagnosis not present

## 2024-03-04 DIAGNOSIS — Z3202 Encounter for pregnancy test, result negative: Secondary | ICD-10-CM | POA: Diagnosis not present

## 2024-03-09 DIAGNOSIS — Z975 Presence of (intrauterine) contraceptive device: Secondary | ICD-10-CM | POA: Diagnosis not present

## 2024-03-09 DIAGNOSIS — L089 Local infection of the skin and subcutaneous tissue, unspecified: Secondary | ICD-10-CM | POA: Diagnosis not present

## 2024-03-09 DIAGNOSIS — L910 Hypertrophic scar: Secondary | ICD-10-CM | POA: Diagnosis not present

## 2024-04-14 DIAGNOSIS — Z111 Encounter for screening for respiratory tuberculosis: Secondary | ICD-10-CM | POA: Diagnosis not present

## 2024-04-14 DIAGNOSIS — Z6828 Body mass index (BMI) 28.0-28.9, adult: Secondary | ICD-10-CM | POA: Diagnosis not present

## 2024-04-14 DIAGNOSIS — E663 Overweight: Secondary | ICD-10-CM | POA: Diagnosis not present

## 2024-04-14 DIAGNOSIS — Z0000 Encounter for general adult medical examination without abnormal findings: Secondary | ICD-10-CM | POA: Diagnosis not present

## 2024-04-29 DIAGNOSIS — L910 Hypertrophic scar: Secondary | ICD-10-CM | POA: Diagnosis not present

## 2024-04-29 DIAGNOSIS — L089 Local infection of the skin and subcutaneous tissue, unspecified: Secondary | ICD-10-CM | POA: Diagnosis not present

## 2024-06-03 DIAGNOSIS — B9689 Other specified bacterial agents as the cause of diseases classified elsewhere: Secondary | ICD-10-CM | POA: Diagnosis not present

## 2024-06-03 DIAGNOSIS — N3080 Other cystitis without hematuria: Secondary | ICD-10-CM | POA: Diagnosis not present

## 2024-06-03 DIAGNOSIS — R300 Dysuria: Secondary | ICD-10-CM | POA: Diagnosis not present

## 2024-06-03 DIAGNOSIS — L219 Seborrheic dermatitis, unspecified: Secondary | ICD-10-CM | POA: Diagnosis not present

## 2024-06-03 DIAGNOSIS — T3695XA Adverse effect of unspecified systemic antibiotic, initial encounter: Secondary | ICD-10-CM | POA: Diagnosis not present

## 2024-06-03 DIAGNOSIS — L910 Hypertrophic scar: Secondary | ICD-10-CM | POA: Diagnosis not present

## 2024-06-03 DIAGNOSIS — L700 Acne vulgaris: Secondary | ICD-10-CM | POA: Diagnosis not present

## 2024-06-03 DIAGNOSIS — L089 Local infection of the skin and subcutaneous tissue, unspecified: Secondary | ICD-10-CM | POA: Diagnosis not present

## 2024-06-03 DIAGNOSIS — B379 Candidiasis, unspecified: Secondary | ICD-10-CM | POA: Diagnosis not present

## 2024-06-03 DIAGNOSIS — N898 Other specified noninflammatory disorders of vagina: Secondary | ICD-10-CM | POA: Diagnosis not present

## 2024-09-19 DIAGNOSIS — E282 Polycystic ovarian syndrome: Secondary | ICD-10-CM | POA: Diagnosis not present

## 2024-09-19 DIAGNOSIS — R7989 Other specified abnormal findings of blood chemistry: Secondary | ICD-10-CM | POA: Diagnosis not present
